# Patient Record
Sex: Male | Born: 2000 | Race: Black or African American | Hispanic: No | Marital: Single | Smoking: Never smoker
Health system: Southern US, Community
[De-identification: ages and names within clinical notes are randomized; demographics above are authoritative.]

## PROBLEM LIST (undated history)

## (undated) HISTORY — PX: WISDOM TOOTH EXTRACTION: SHX21

---
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
## 2021-10-26 ENCOUNTER — Other Ambulatory Visit (HOSPITAL_COMMUNITY)
Admission: RE | Admit: 2021-10-26 | Discharge: 2021-10-26 | Disposition: A | Discharge status: Home / Self Care | Payer: Medicaid Other | Source: Ambulatory Visit | Attending: Family Medicine | Admitting: Family Medicine

## 2021-10-26 ENCOUNTER — Encounter: Payer: Self-pay | Admitting: Family Medicine

## 2021-10-26 ENCOUNTER — Other Ambulatory Visit: Payer: Self-pay

## 2021-10-26 ENCOUNTER — Ambulatory Visit (INDEPENDENT_AMBULATORY_CARE_PROVIDER_SITE_OTHER): Payer: Medicaid Other | Admitting: Family Medicine

## 2021-10-26 VITALS — BP 118/88 | HR 76 | Ht 69.0 in | Wt 251.0 lb

## 2021-10-26 DIAGNOSIS — Z1159 Encounter for screening for other viral diseases: Secondary | ICD-10-CM

## 2021-10-26 DIAGNOSIS — Z113 Encounter for screening for infections with a predominantly sexual mode of transmission: Secondary | ICD-10-CM | POA: Diagnosis not present

## 2021-10-26 NOTE — Progress Notes (Signed)
° ° ° ° ° ° °  SUBJECTIVE:   Chief compliant/HPI: annual examination  Matthew Cherry is a 20 y.o. who presents today for an annual exam.   Reviewed and updated history.   Review of systems form notable for. He is sexually active. Uses condoms.  Going to school for communications at [employer] to pursue advertising in the future. Ramelo denies smoking, drinking alcohol and illicit drug use.    OBJECTIVE:   BP 118/88    Pulse 76    Ht 5\' 9"  (1.753 m)    Wt 251 lb (113.9 kg)    SpO2 98%    BMI 37.07 kg/m    GEN: pleasant well appearing male, in no acute distress  CV: regular rate and rhythm, no murmurs appreciated  RESP: no increased work of breathing, clear to ascultation bilaterally ABD: Bowel sounds present. Soft, non-tender, non-distended.  MSK: no edema, or calf tenderness SKIN: warm, dry, no rash on visible skin NEURO: alert, moves all extremities appropriately, strength 5/5 bilaterally, gross sensation intact  PSYCH: Normal affect, appropriate speech and behavior    ASSESSMENT/PLAN:   Annual Examination  See AVS for age appropriate recommendations  PHQ score 0, reviewed and discussed.  Blood pressure reviewed and at goal.     Considered the following items based upon USPSTF recommendations: HIV testing: ordered Hepatitis C: ordered Hepatitis B: not indicated Syphilis if at high risk: {ordered GC/CTordered Lipid panel (nonfasting or fasting) discussed based upon AHA recommendations and not ordered.  Reviewed risk factors for latent tuberculosis and not indicated Immunizations: Pt received vaccines at [facility] however they are seen in [place].  He has a form at home.  Advised to bring form to clinic and schedule RN visit to obtain needed vaccines.     Follow up in 1 year or sooner if indicated.    San german, DO [facility]

## 2021-10-26 NOTE — Patient Instructions (Signed)
Today at your annual preventive visit we talked about the following measures:   I recommend 150 minutes of exercise per week-try 30 minutes 5 days per week We discussed reducing sugary beverages (like soda and juice) and increasing leafy greens and whole fruits.  We discussed avoiding tobacco and alcohol.  I recommend avoiding illicit substances.  Your blood pressure is 18/88 at goal of <120/<80.   Be sure to bring your vaccine sheet to our office. You can follow up for a nurse visit to get your vaccines that are needed for school.  Feel free to call with questions, [phone].  Take Care,  Dr Rachael Darby

## 2021-10-27 LAB — HIV ANTIBODY (ROUTINE TESTING W REFLEX): HIV Screen 4th Generation wRfx: NONREACTIVE

## 2021-10-27 LAB — HCV INTERPRETATION

## 2021-10-27 LAB — HCV AB W REFLEX TO QUANT PCR: HCV Ab: 0.2 s/co ratio (ref 0.0–0.9)

## 2021-10-27 LAB — RPR W/REFLEX TO TREPSURE

## 2021-10-30 LAB — URINE CYTOLOGY ANCILLARY ONLY
Chlamydia: NEGATIVE
Comment: NEGATIVE
Comment: NEGATIVE
Comment: NORMAL
Neisseria Gonorrhea: NEGATIVE
Trichomonas: NEGATIVE

## 2021-10-30 LAB — RPR W/REFLEX TO TREPSURE: RPR: NONREACTIVE

## 2021-10-30 LAB — T PALLIDUM ANTIBODY, EIA: T pallidum Antibody, EIA: NEGATIVE

## 2022-04-30 ENCOUNTER — Encounter (HOSPITAL_COMMUNITY): Payer: Self-pay

## 2022-04-30 ENCOUNTER — Ambulatory Visit (HOSPITAL_COMMUNITY): Payer: Self-pay

## 2022-04-30 ENCOUNTER — Ambulatory Visit (HOSPITAL_COMMUNITY)
Admission: EM | Admit: 2022-04-30 | Discharge: 2022-04-30 | Disposition: A | Discharge status: Home / Self Care | Payer: Medicaid Other | Attending: Student | Admitting: Student

## 2022-04-30 DIAGNOSIS — Z23 Encounter for immunization: Secondary | ICD-10-CM | POA: Diagnosis not present

## 2022-04-30 DIAGNOSIS — S61216A Laceration without foreign body of right little finger without damage to nail, initial encounter: Secondary | ICD-10-CM | POA: Diagnosis not present

## 2022-04-30 MED ORDER — TETANUS-DIPHTH-ACELL PERTUSSIS 5-2.5-18.5 LF-MCG/0.5 IM SUSY
PREFILLED_SYRINGE | INTRAMUSCULAR | Status: AC
  Filled 2022-04-30: qty 0.5

## 2022-04-30 MED ORDER — TETANUS-DIPHTH-ACELL PERTUSSIS 5-2.5-18.5 LF-MCG/0.5 IM SUSY
0.5000 mL | PREFILLED_SYRINGE | Freq: Once | INTRAMUSCULAR | Status: AC
  Administered 2022-04-30: 0.5 mL via INTRAMUSCULAR

## 2022-04-30 NOTE — ED Provider Notes (Addendum)
[facility]    CSN: [id number] Arrival date & time: 04/30/22  1250      History   Chief Complaint Chief Complaint  Patient presents with   Finger Injury    HPI Matthew Cherry is a 21 y.o. male presenting with R little finger laceration x20 hours. History noncontributory. Unsure last Tdap.  States he was moving furniture 1 day ago, soon after noticed bleeding to the R little finger. Bleeding has stopped but he is concerned he needs a tdap. He is not immunocompromised.   HPI  History reviewed. No pertinent past medical history.  Patient Active Problem List   Diagnosis Date Noted   Bloody sputum 10/15/2020   Rash 04/29/2017   Obesity 11/01/2014   Hypertension 11/01/2014   Insomnia 11/01/2014   Allergic rhinitis 10/31/2014   Eczema 10/31/2014    Past Surgical History:  Procedure Laterality Date   WISDOM TOOTH EXTRACTION         Home Medications    Prior to Admission medications   Not on File    Family History History reviewed. No pertinent family history.  Social History Social History   Tobacco Use   Smoking status: Never   Smokeless tobacco: Never     Allergies   Patient has no known allergies.   Review of Systems Review of Systems  Skin:  Positive for wound.  All other systems reviewed and are negative.    Physical Exam Triage Vital Signs ED Triage Vitals  Enc Vitals Group     BP 04/30/22 1305 (!) 142/85     Pulse Rate 04/30/22 1305 78     Resp 04/30/22 1305 16     Temp 04/30/22 1305 98.1 F (36.7 C)     Temp Source 04/30/22 1305 Oral     SpO2 04/30/22 1305 96 %     Weight --      Height --      Head Circumference --      Peak Flow --      Pain Score 04/30/22 1306 0     Pain Loc --      Pain Edu? --      Excl. in GC? --    No data found.  Updated Vital Signs BP (!) 142/85 (BP Location: Right Arm)   Pulse 78   Temp 98.1 F (36.7 C) (Oral)   Resp 16   SpO2 96%   Visual Acuity Right Eye Distance:   Left Eye  Distance:   Bilateral Distance:    Right Eye Near:   Left Eye Near:    Bilateral Near:     Physical Exam Vitals reviewed.  Constitutional:      General: He is not in acute distress.    Appearance: Normal appearance. He is not ill-appearing.  HENT:     Head: Normocephalic and atraumatic.  Pulmonary:     Effort: Pulmonary effort is normal.  Skin:    Comments: See image below  R little finger with small skin avulsion overlying the PIP joint. Flap is still attached laterally. No bleeding. Cap refill <2 seconds, radial pulse 2+. No bony tenderness.   Neurological:     General: No focal deficit present.     Mental Status: He is alert and oriented to person, place, and time.  Psychiatric:        Mood and Affect: Mood normal.        Behavior: Behavior normal.        Thought Content:  Thought content normal.        Judgment: Judgment normal.        UC Treatments / Results  Labs (all labs ordered are listed, but only abnormal results are displayed) Labs Reviewed - No data to display  EKG   Radiology No results found.  Procedures Procedures (including critical care time)  Medications Ordered in UC Medications  Tdap (BOOSTRIX) injection 0.5 mL (0.5 mLs Intramuscular Given 04/30/22 1335)    Initial Impression / Assessment and Plan / UC Course  I have reviewed the triage vital signs and the nursing notes.  Pertinent labs & imaging results that were available during my care of the patient were reviewed by me and considered in my medical decision making (see chart for details).     This patient is a very pleasant 21 y.o. year old male presenting with R little finger skin avulsion. Neurovascularly intact, bleeding is controlled. Discussed option of steri strep, which he declines in favor of bandaid. I am in agreement. Tdap administered. ED return precautions discussed. Patient verbalizes understanding and agreement.   Final Clinical Impressions(s) / UC Diagnoses   Final  diagnoses:  Need for Tdap vaccination  Laceration of right little finger without foreign body without damage to nail, initial encounter     Discharge Instructions      -Wash your wound with gentle soap and water 1-2 times daily.  Let air dry or gently pat. You can follow with over-the-counter neosporin ointment (or similar). Keep wrapped during the day or when you're doing something that could get it dirty (working, [employer], cooking, Catering manager). Avoid cleansing with hydrogen peroxide or alcohol!! -Seek additional medical attention if the wound is getting worse instead of better- redness increasing in size, pain getting worse, new/worsening discharge, new fevers/chills, etc. -You can probably use clean scissors or nail clippers to clip off the skin flap over the next few days. This will be easier as the skin dies and shrivels up.    ED Prescriptions   None    PDMP not reviewed this encounter.   Rhys Martini, PA-C 04/30/22 1352    Rhys Martini, PA-C 05/03/22 1558

## 2023-06-27 ENCOUNTER — Emergency Department (HOSPITAL_COMMUNITY)
Admission: EM | Admit: 2023-06-27 | Discharge: 2023-06-28 | Disposition: A | Discharge status: Home / Self Care | Payer: Medicaid Other | Attending: Emergency Medicine | Admitting: Emergency Medicine

## 2023-06-27 ENCOUNTER — Other Ambulatory Visit: Payer: Self-pay

## 2023-06-27 ENCOUNTER — Encounter (HOSPITAL_COMMUNITY): Payer: Self-pay | Admitting: Emergency Medicine

## 2023-06-27 ENCOUNTER — Emergency Department (HOSPITAL_COMMUNITY): Payer: Medicaid Other

## 2023-06-27 DIAGNOSIS — K625 Hemorrhage of anus and rectum: Secondary | ICD-10-CM | POA: Insufficient documentation

## 2023-06-27 DIAGNOSIS — K760 Fatty (change of) liver, not elsewhere classified: Secondary | ICD-10-CM | POA: Diagnosis not present

## 2023-06-27 LAB — COMPREHENSIVE METABOLIC PANEL
ALT: 32 U/L (ref 0–44)
AST: 25 U/L (ref 15–41)
Albumin: 4.2 g/dL (ref 3.5–5.0)
Alkaline Phosphatase: 88 U/L (ref 38–126)
Anion gap: 9 (ref 5–15)
BUN: 12 mg/dL (ref 6–20)
CO2: 26 mmol/L (ref 22–32)
Calcium: 8.8 mg/dL — ABNORMAL LOW (ref 8.9–10.3)
Chloride: 102 mmol/L (ref 98–111)
Creatinine, Ser: 0.91 mg/dL (ref 0.61–1.24)
GFR, Estimated: >60 mL/min (ref >60)
Glucose, Bld: 101 mg/dL — ABNORMAL HIGH (ref 70–99)
Potassium: 3.7 mmol/L (ref 3.5–5.1)
Sodium: 137 mmol/L (ref 135–145)
Total Bilirubin: 0.7 mg/dL (ref 0.3–1.2)
Total Protein: 8.1 g/dL (ref 6.5–8.1)

## 2023-06-27 LAB — CBC
HCT: 47.8 % (ref 39.0–52.0)
Hemoglobin: 15.2 g/dL (ref 13.0–17.0)
MCH: 26.6 pg (ref 26.0–34.0)
MCHC: 31.8 g/dL (ref 30.0–36.0)
MCV: 83.6 fL (ref 80.0–100.0)
Platelets: 261 10*3/uL (ref 150–400)
RBC: 5.72 MIL/uL (ref 4.22–5.81)
RDW: 12.8 % (ref 11.5–15.5)
WBC: 7.5 10*3/uL (ref 4.0–10.5)
nRBC: 0 % (ref 0.0–0.2)

## 2023-06-27 MED ORDER — IOHEXOL 300 MG/ML  SOLN
100.0000 mL | Freq: Once | INTRAMUSCULAR | Status: AC | PRN
  Administered 2023-06-27: 100 mL via INTRAVENOUS

## 2023-06-27 NOTE — ED Triage Notes (Signed)
Pt reports rectal bleeding x 2 days. Pt reports blood is bright red & has clots.

## 2023-06-27 NOTE — ED Provider Notes (Signed)
[place] [facility] AT [facility] Provider Note   CSN: [id number] Arrival date & time: 06/27/23  2213     History {Add pertinent medical, surgical, social history, OB history to HPI:1} Chief Complaint  Patient presents with   Rectal Bleeding    Matthew Cherry is a 22 y.o. male.  Patient here with rectal bleeding for the past 2 days.  States he has not been constipated but he had some blood mixed with brown stool yesterday and again today.  Gross blood with wiping and blood in toilet bowl.  Bleeding is bright red.  Denies any pain.  Denies any dizziness or lightheadedness.  Denies any chest pain, shortness of breath, abdominal pain, nausea or vomiting.  No blood thinner use.  No history of ulcers, ulcerative colitis or Crohn's disease.  He denies any medical history and takes no medications.  He states he was not straining.  Stool is light brown with red blood mixed in.  There is some blood with wiping or blood in the toilet bowl.   Rectal Bleeding Associated symptoms: no fever and no vomiting        Home Medications Prior to Admission medications   Not on File      Allergies    Patient has no known allergies.    Review of Systems   Review of Systems  Constitutional:  Negative for activity change, appetite change and fever.  HENT:  Negative for congestion and rhinorrhea.   Respiratory:  Negative for cough, chest tightness and shortness of breath.   Cardiovascular:  Negative for chest pain.  Gastrointestinal:  Positive for blood in stool and hematochezia. Negative for nausea and vomiting.  Genitourinary:  Negative for dysuria and hematuria.  Musculoskeletal:  Negative for arthralgias and myalgias.  Skin:  Negative for rash.  Neurological:  Negative for weakness and headaches.   all other systems are negative except as noted in the HPI and PMH.    Physical Exam Updated Vital Signs BP (!) 162/125   Pulse 79   Temp 98 F (36.7 C) (Oral)   Resp  18   SpO2 99%  Physical Exam Vitals and nursing note reviewed.  Constitutional:      General: He is not in acute distress.    Appearance: He is well-developed.  HENT:     Head: Normocephalic and atraumatic.     Mouth/Throat:     Pharynx: No oropharyngeal exudate.  Eyes:     Conjunctiva/sclera: Conjunctivae normal.     Pupils: Pupils are equal, round, and reactive to light.  Neck:     Comments: No meningismus. Cardiovascular:     Rate and Rhythm: Normal rate and regular rhythm.     Heart sounds: Normal heart sounds. No murmur heard. Pulmonary:     Effort: Pulmonary effort is normal. No respiratory distress.     Breath sounds: Normal breath sounds.  Abdominal:     Palpations: Abdomen is soft.     Tenderness: There is no abdominal tenderness. There is no guarding or rebound.  Genitourinary:    Comments: No hemorrhoids no fissures.  Light brown stool. Musculoskeletal:        General: No tenderness. Normal range of motion.     Cervical back: Normal range of motion and neck supple.  Skin:    General: Skin is warm.  Neurological:     Mental Status: He is alert and oriented to person, place, and time.     Cranial Nerves: No cranial  nerve deficit.     Motor: No abnormal muscle tone.     Coordination: Coordination normal.     Comments:  5/5 strength throughout. CN 2-12 intact.Equal grip strength.   Psychiatric:        Behavior: Behavior normal.     ED Results / Procedures / Treatments   Labs (all labs ordered are listed, but only abnormal results are displayed) Labs Reviewed  COMPREHENSIVE METABOLIC PANEL  CBC  LIPASE, BLOOD  POC OCCULT BLOOD, ED  TYPE AND SCREEN    EKG None  Radiology No results found.  Procedures Procedures  {Document cardiac monitor, telemetry assessment procedure when appropriate:1}  Medications Ordered in ED Medications - No data to display  ED Course/ Medical Decision Making/ A&P   {   Click here for ABCD2, HEART and other  calculatorsREFRESH Note before signing :1}                              Medical Decision Making Amount and/or Complexity of Data Reviewed Labs: ordered. Decision-making details documented in ED Course. Radiology: ordered and independent interpretation performed. Decision-making details documented in ED Course. ECG/medicine tests: independent interpretation performed. Decision-making details documented in ED Course.  Rectal bleeding x 2 days.  Stable vital signs.  Abdomen soft and nontender.  Concern for likely hemorrhoidal or diverticular bleeding. Hemodynamically stable.  {Document critical care time when appropriate:1} {Document review of labs and clinical decision tools ie heart score, Chads2Vasc2 etc:1}  {Document your independent review of radiology images, and any outside records:1} {Document your discussion with family members, caretakers, and with consultants:1} {Document social determinants of health affecting pt's care:1} {Document your decision making why or why not admission, treatments were needed:1} Final Clinical Impression(s) / ED Diagnoses Final diagnoses:  None    Rx / DC Orders ED Discharge Orders     None

## 2023-06-28 DIAGNOSIS — K760 Fatty (change of) liver, not elsewhere classified: Secondary | ICD-10-CM | POA: Diagnosis not present

## 2023-06-28 LAB — TYPE AND SCREEN
ABO/RH(D): O POS
Antibody Screen: NEGATIVE

## 2023-06-28 LAB — POC OCCULT BLOOD, ED: Fecal Occult Bld: NEGATIVE

## 2023-06-28 MED ORDER — DOCUSATE SODIUM 100 MG PO CAPS: 100.0000 mg | ORAL_CAPSULE | Freq: Two times a day (BID) | ORAL | 0 refills | Status: AC

## 2023-06-28 MED ORDER — LACTATED RINGERS IV BOLUS
1000.0000 mL | Freq: Once | INTRAVENOUS | Status: AC
Start: 2023-06-28 — End: 2023-06-28
  Administered 2023-06-28: 1000 mL via INTRAVENOUS

## 2023-06-28 NOTE — Discharge Instructions (Signed)
We suspect your bleeding is likely from hemorrhoids or diverticula.  Take the stool softener as prescribed.  Follow-up with your primary doctor as well as the gastroenterologist.  Return to the ED with worsening pain, bleeding, dizziness, lightheadedness, chest pain, shortness of breath or other concerns.

## 2023-11-04 ENCOUNTER — Encounter (HOSPITAL_BASED_OUTPATIENT_CLINIC_OR_DEPARTMENT_OTHER): Payer: Self-pay | Admitting: Emergency Medicine

## 2023-11-04 ENCOUNTER — Other Ambulatory Visit: Payer: Self-pay

## 2023-11-04 DIAGNOSIS — R220 Localized swelling, mass and lump, head: Secondary | ICD-10-CM | POA: Insufficient documentation

## 2023-11-04 DIAGNOSIS — M542 Cervicalgia: Secondary | ICD-10-CM | POA: Diagnosis not present

## 2023-11-04 DIAGNOSIS — R509 Fever, unspecified: Secondary | ICD-10-CM | POA: Diagnosis not present

## 2023-11-04 DIAGNOSIS — Z20822 Contact with and (suspected) exposure to covid-19: Secondary | ICD-10-CM | POA: Insufficient documentation

## 2023-11-04 DIAGNOSIS — R519 Headache, unspecified: Secondary | ICD-10-CM | POA: Insufficient documentation

## 2023-11-04 DIAGNOSIS — I10 Essential (primary) hypertension: Secondary | ICD-10-CM | POA: Diagnosis not present

## 2023-11-04 LAB — CBC WITH DIFFERENTIAL/PLATELET
Abs Immature Granulocytes: 0.02 10*3/uL (ref 0.00–0.07)
Basophils Absolute: 0 10*3/uL (ref 0.0–0.1)
Basophils Relative: 1 %
Eosinophils Absolute: 0.1 10*3/uL (ref 0.0–0.5)
Eosinophils Relative: 1 %
HCT: 49.4 % (ref 39.0–52.0)
Hemoglobin: 16.3 g/dL (ref 13.0–17.0)
Immature Granulocytes: 0 %
Lymphocytes Relative: 17 %
Lymphs Abs: 1 10*3/uL (ref 0.7–4.0)
MCH: 26.5 pg (ref 26.0–34.0)
MCHC: 33 g/dL (ref 30.0–36.0)
MCV: 80.3 fL (ref 80.0–100.0)
Monocytes Absolute: 0.7 10*3/uL (ref 0.1–1.0)
Monocytes Relative: 12 %
Neutro Abs: 4 10*3/uL (ref 1.7–7.7)
Neutrophils Relative %: 69 %
Platelets: 218 10*3/uL (ref 150–400)
RBC: 6.15 MIL/uL — ABNORMAL HIGH (ref 4.22–5.81)
RDW: 13.1 % (ref 11.5–15.5)
WBC: 5.8 10*3/uL (ref 4.0–10.5)
nRBC: 0 % (ref 0.0–0.2)

## 2023-11-04 MED ORDER — ACETAMINOPHEN 325 MG PO TABS
650.0000 mg | ORAL_TABLET | Freq: Once | ORAL | Status: AC | PRN
  Administered 2023-11-04: 650 mg via ORAL
  Filled 2023-11-04: qty 2

## 2023-11-04 NOTE — ED Triage Notes (Addendum)
Pt ambulates into the ED for face pain that turned into a headache and neck pain. Pt states symptoms started today. Pt rates pain 8/10, pt took ibuprofen 2 hours ago.  Pt is also reporting nasal congestion and fatigue.

## 2023-11-05 ENCOUNTER — Emergency Department (HOSPITAL_BASED_OUTPATIENT_CLINIC_OR_DEPARTMENT_OTHER)
Admission: EM | Admit: 2023-11-05 | Discharge: 2023-11-05 | Disposition: A | Discharge status: Home / Self Care | Payer: Medicaid Other | Attending: Emergency Medicine | Admitting: Emergency Medicine

## 2023-11-05 DIAGNOSIS — R509 Fever, unspecified: Secondary | ICD-10-CM

## 2023-11-05 LAB — BASIC METABOLIC PANEL
Anion gap: 10 (ref 5–15)
BUN: 9 mg/dL (ref 6–20)
CO2: 27 mmol/L (ref 22–32)
Calcium: 9.3 mg/dL (ref 8.9–10.3)
Chloride: 98 mmol/L (ref 98–111)
Creatinine, Ser: 0.92 mg/dL (ref 0.61–1.24)
GFR, Estimated: >60 mL/min (ref >60)
Glucose, Bld: 110 mg/dL — ABNORMAL HIGH (ref 70–99)
Potassium: 3.7 mmol/L (ref 3.5–5.1)
Sodium: 135 mmol/L (ref 135–145)

## 2023-11-05 LAB — RESP PANEL BY RT-PCR (RSV, FLU A&B, COVID)  RVPGX2
Influenza A by PCR: NEGATIVE
Influenza B by PCR: NEGATIVE
Resp Syncytial Virus by PCR: NEGATIVE
SARS Coronavirus 2 by RT PCR: NEGATIVE

## 2023-11-05 NOTE — ED Provider Notes (Signed)
 [place] EMERGENCY DEPARTMENT AT [facility] Provider Note   CSN: [id number] Arrival date & time: 11/04/23  2303     History  Chief Complaint  Patient presents with   Headache   Neck Pain    Matthew Cherry is a 23 y.o. male.   Headache Associated symptoms: fever and neck pain   Neck Pain Associated symptoms: fever and headaches   Patient presents for multiple complaints.  Medical history includes HTN, obesity.  For the past 2 days, he has been experiencing right-sided facial swelling and pain.  He states that it started in his temporal area and then radiated down into his cheek.  He felt like the right side of his throat was swollen.  He denies any recent tooth pain.  He has not had difficulty chewing or sensitivity to hot or cold liquids.  He does describe a subjective fevers.  Currently, he does not feel any areas of swelling.  He states that this has resolved since he arrived in the ED waiting room.     Home Medications Prior to Admission medications   Medication Sig Start Date End Date Taking? Authorizing Provider  docusate sodium  (COLACE) 100 MG capsule Take 1 capsule (100 mg total) by mouth every 12 (twelve) hours. 06/28/23   Carita Senior, MD      Allergies    Patient has no known allergies.    Review of Systems   Review of Systems  Constitutional:  Positive for fever.  HENT:  Positive for facial swelling.   Musculoskeletal:  Positive for neck pain.  Neurological:  Positive for headaches.  All other systems reviewed and are negative.   Physical Exam Updated Vital Signs BP (!) 157/103   Pulse 84   Temp 99.3 F (37.4 C)   Resp 18   Ht 5' 9 (1.753 m)   Wt 113.4 kg   SpO2 97%   BMI 36.92 kg/m  Physical Exam Vitals and nursing note reviewed.  Constitutional:      General: He is not in acute distress.    Appearance: He is well-developed. He is not ill-appearing, toxic-appearing or diaphoretic.  HENT:     Head: Normocephalic and  atraumatic.     Mouth/Throat:     Mouth: Mucous membranes are moist.  Eyes:     Extraocular Movements: Extraocular movements intact.     Conjunctiva/sclera: Conjunctivae normal.  Cardiovascular:     Rate and Rhythm: Normal rate and regular rhythm.  Pulmonary:     Effort: Pulmonary effort is normal. No respiratory distress.  Abdominal:     General: There is no distension.     Palpations: Abdomen is soft.  Musculoskeletal:        General: No swelling. Normal range of motion.     Cervical back: Normal range of motion and neck supple.  Skin:    General: Skin is warm and dry.  Neurological:     Mental Status: He is alert and oriented to person, place, and time.     Cranial Nerves: No cranial nerve deficit, dysarthria or facial asymmetry.     Sensory: No sensory deficit.     Motor: No weakness.  Psychiatric:        Mood and Affect: Affect is flat.        Behavior: Behavior is slowed. Behavior is cooperative.     ED Results / Procedures / Treatments   Labs (all labs ordered are listed, but only abnormal results are displayed) Labs Reviewed  CBC  WITH DIFFERENTIAL/PLATELET - Abnormal; Notable for the following components:      Result Value   RBC 6.15 (*)    All other components within normal limits  BASIC METABOLIC PANEL - Abnormal; Notable for the following components:   Glucose, Bld 110 (*)    All other components within normal limits  RESP PANEL BY RT-PCR (RSV, FLU A&B, COVID)  [id number]    EKG None  Radiology No results found.  Procedures Procedures    Medications Ordered in ED Medications  acetaminophen  (TYLENOL ) tablet 650 mg (650 mg Oral Given 11/04/23 2336)    ED Course/ Medical Decision Making/ A&P                                 Medical Decision Making Amount and/or Complexity of Data Reviewed Labs: ordered.  Risk OTC drugs.   Patient presenting for multiple complaints.  Among these are a sensation of right-sided facial pain and swelling, in  addition to subjective fever.  On exam, I do not appreciate any swelling.  He has no facial tenderness.  He has a filling in his right lower molar.  There is no obvious tooth decay.  There is no gingival findings.  Oropharynx is nonerythematous with no unilateral swelling.  He has no difficulty speaking or swallowing.  Given absence of objective findings on exam, in addition to his report of unilateral swelling, I have low suspicion of an allergic reaction.  Although he describes subjective fevers, he is afebrile in the ED.  His COVID and flu testing were negative.  No leukocytosis is present.  Patient was advised to continue supportive care at home with ibuprofen  and Tylenol  as needed for pain or subjective fevers.  He was discharged in stable condition.        Final Clinical Impression(s) / ED Diagnoses Final diagnoses:  Subjective fever    Rx / DC Orders ED Discharge Orders     None         Melvenia Motto, MD 11/05/23 [phone]

## 2023-11-05 NOTE — Discharge Instructions (Signed)
 Lab work today is reassuring.  Take ibuprofen and Tylenol at home as needed for pain or fevers.  Stay hydrated.  Return to the emergency department for any new or worsening symptoms of concern.

## 2023-11-07 DIAGNOSIS — Z20822 Contact with and (suspected) exposure to covid-19: Secondary | ICD-10-CM | POA: Diagnosis not present

## 2023-11-07 DIAGNOSIS — J019 Acute sinusitis, unspecified: Secondary | ICD-10-CM | POA: Diagnosis not present

## 2023-11-07 DIAGNOSIS — R509 Fever, unspecified: Secondary | ICD-10-CM | POA: Diagnosis not present

## 2023-11-07 DIAGNOSIS — J029 Acute pharyngitis, unspecified: Secondary | ICD-10-CM | POA: Diagnosis not present

## 2023-11-19 DIAGNOSIS — J029 Acute pharyngitis, unspecified: Secondary | ICD-10-CM | POA: Diagnosis not present

## 2023-11-19 DIAGNOSIS — B2799 Infectious mononucleosis, unspecified with other complication: Secondary | ICD-10-CM | POA: Diagnosis not present
